# Patient Record
Sex: Male | Born: 1935 | Race: White | Hispanic: No | Marital: Married | Smoking: Former smoker
Health system: Southern US, Community
[De-identification: ages and names within clinical notes are randomized; demographics above are authoritative.]

## PROBLEM LIST (undated history)

## (undated) DIAGNOSIS — C801 Malignant (primary) neoplasm, unspecified: Secondary | ICD-10-CM

## (undated) DIAGNOSIS — I10 Essential (primary) hypertension: Secondary | ICD-10-CM

## (undated) DIAGNOSIS — C679 Malignant neoplasm of bladder, unspecified: Secondary | ICD-10-CM

## (undated) DIAGNOSIS — E7800 Pure hypercholesterolemia, unspecified: Secondary | ICD-10-CM

## (undated) DIAGNOSIS — K219 Gastro-esophageal reflux disease without esophagitis: Secondary | ICD-10-CM

## (undated) HISTORY — PX: BACK SURGERY: SHX140

## (undated) HISTORY — PX: BLADDER SURGERY: SHX569

## (undated) HISTORY — PX: THROAT SURGERY: SHX803

---

## 2014-01-12 DIAGNOSIS — N138 Other obstructive and reflux uropathy: Secondary | ICD-10-CM | POA: Insufficient documentation

## 2014-01-12 DIAGNOSIS — N401 Enlarged prostate with lower urinary tract symptoms: Secondary | ICD-10-CM

## 2014-01-12 DIAGNOSIS — N529 Male erectile dysfunction, unspecified: Secondary | ICD-10-CM | POA: Insufficient documentation

## 2014-01-12 DIAGNOSIS — N281 Cyst of kidney, acquired: Secondary | ICD-10-CM | POA: Insufficient documentation

## 2014-01-19 DIAGNOSIS — K2270 Barrett's esophagus without dysplasia: Secondary | ICD-10-CM | POA: Insufficient documentation

## 2014-01-19 DIAGNOSIS — I739 Peripheral vascular disease, unspecified: Secondary | ICD-10-CM

## 2014-01-19 DIAGNOSIS — I779 Disorder of arteries and arterioles, unspecified: Secondary | ICD-10-CM | POA: Insufficient documentation

## 2014-01-19 DIAGNOSIS — I10 Essential (primary) hypertension: Secondary | ICD-10-CM | POA: Insufficient documentation

## 2014-01-19 DIAGNOSIS — K760 Fatty (change of) liver, not elsewhere classified: Secondary | ICD-10-CM | POA: Insufficient documentation

## 2014-10-21 DIAGNOSIS — R94120 Abnormal auditory function study: Secondary | ICD-10-CM | POA: Insufficient documentation

## 2015-03-24 DIAGNOSIS — L570 Actinic keratosis: Secondary | ICD-10-CM | POA: Insufficient documentation

## 2015-10-21 ENCOUNTER — Ambulatory Visit: Payer: Medicare Other

## 2015-10-21 ENCOUNTER — Ambulatory Visit
Admission: EM | Admit: 2015-10-21 | Discharge: 2015-10-21 | Disposition: A | Discharge status: Home / Self Care | Payer: Medicare Other | Attending: Family Medicine | Admitting: Family Medicine

## 2015-10-21 DIAGNOSIS — Z87891 Personal history of nicotine dependence: Secondary | ICD-10-CM | POA: Diagnosis not present

## 2015-10-21 DIAGNOSIS — J0110 Acute frontal sinusitis, unspecified: Secondary | ICD-10-CM | POA: Diagnosis not present

## 2015-10-21 DIAGNOSIS — R05 Cough: Secondary | ICD-10-CM | POA: Insufficient documentation

## 2015-10-21 DIAGNOSIS — J0100 Acute maxillary sinusitis, unspecified: Secondary | ICD-10-CM | POA: Diagnosis not present

## 2015-10-21 DIAGNOSIS — J8410 Pulmonary fibrosis, unspecified: Secondary | ICD-10-CM | POA: Diagnosis not present

## 2015-10-21 DIAGNOSIS — I700 Atherosclerosis of aorta: Secondary | ICD-10-CM | POA: Diagnosis not present

## 2015-10-21 HISTORY — DX: Malignant neoplasm of bladder, unspecified: C67.9

## 2015-10-21 HISTORY — DX: Essential (primary) hypertension: I10

## 2015-10-21 HISTORY — DX: Malignant (primary) neoplasm, unspecified: C80.1

## 2015-10-21 HISTORY — DX: Pure hypercholesterolemia, unspecified: E78.00

## 2015-10-21 HISTORY — DX: Gastro-esophageal reflux disease without esophagitis: K21.9

## 2015-10-21 MED ORDER — DOXYCYCLINE HYCLATE 100 MG PO CAPS: 100.0000 mg | ORAL_CAPSULE | Freq: Two times a day (BID) | ORAL | Status: DC

## 2015-10-21 MED ORDER — GUAIFENESIN-CODEINE 100-10 MG/5ML PO SOLN: 5.0000 mL | Freq: Three times a day (TID) | ORAL | Status: DC | PRN

## 2015-10-21 NOTE — ED Provider Notes (Signed)
[facility]  ____________________________________________  Time seen: Approximately 10:33 AM  I have reviewed the triage vital signs and the nursing notes.   HISTORY  Chief Complaint URI   HPI Miguel Krause is a 80 y.o. male presents for complaint of one week of runny nose, nasal congestion, sinus pressure, sinus drainage and intermittent cough. Patient reports frequently blowing nose and getting thick greenish nasal drainage. patient states that cough is only occasionally productive. States cough is usually dry can feel the drainage in the back of his throat leading to cough. Reports continues to eat and drink well. Reports wife at home with similar.  States current sinus discomfort is described as a pressure and feels clogged at 2 out of 10. Denies pain radiation. States symptoms have been unresolved with over-the-counter NyQuil or DayQuil. Denies falls or injury.  Denies chest pain, shortness of breath, abdominal pain, weakness, dizziness, neck or back pain. Denies recent sickness. Denies recent antibiotic use.  PCP: [place]   Past Medical History  Diagnosis Date  . Hypertension   . GERD (gastroesophageal reflux disease)   . Hypercholesteremia   . Cancer ([place])   . Bladder cancer ([facility])     There are no active problems to display for this patient.   Past Surgical History  Procedure Laterality Date  . Back surgery    . Bladder surgery      Current Outpatient Rx  Name  Route  Sig  Dispense  Refill  . amLODipine (NORVASC) 10 MG tablet   Oral   Take 10 mg by mouth daily.         Marland Kitchen aspirin 81 MG tablet   Oral   Take 81 mg by mouth daily.         . Coenzyme Q10 (COQ10) 200 MG CAPS   Oral   Take 200 mg by mouth.         . losartan (COZAAR) 100 MG tablet   Oral   Take 100 mg by mouth daily.         Marland Kitchen omeprazole (PRILOSEC) 40 MG capsule   Oral   Take 40 mg by mouth daily.         . pravastatin (PRAVACHOL) 20 MG tablet   Oral   Take 20 mg  by mouth daily.         .           .             Allergies Penicillins and Zocor  Family History  Problem Relation Age of Onset  . Heart failure Mother   . Cancer Father     Social History Social History  Substance Use Topics  . Smoking status: Former Research scientist (life sciences)  . Smokeless tobacco: None  . Alcohol Use: Yes     Comment: socially    Review of Systems Constitutional: No fever/chills Eyes: No visual changes. ENT: No sore throat.Positive for runny nose, nasal congestion, sinus drainage and sinus pressure. Positive intermittent cough.  Cardiovascular: Denies chest pain. Respiratory: Denies shortness of breath. Gastrointestinal: No abdominal pain.  No nausea, no vomiting.  No diarrhea.  No constipation. Genitourinary: Negative for dysuria. Musculoskeletal: Negative for back pain. Skin: Negative for rash. Neurological: Negative for headaches, focal weakness or numbness.  10-point ROS otherwise negative.  ____________________________________________   PHYSICAL EXAM:  VITAL SIGNS: ED Triage Vitals  Enc Vitals Group     BP 10/21/15 0931 159/75 mmHg     Pulse Rate 10/21/15 0931 92  Resp 10/21/15 0931 18     Temp 10/21/15 0931 98 F (36.7 C)     Temp Source 10/21/15 0931 Tympanic     SpO2 10/21/15 0931 98 %     Weight 10/21/15 0931 215 lb (97.523 kg)     Height 10/21/15 0931 6\' 1"  (1.854 m)     Head Cir --      Peak Flow --      Pain Score 10/21/15 0935 6     Pain Loc --      Pain Edu? --      Excl. in [place]? --     Constitutional: Alert and oriented. Well appearing and in no acute distress. Eyes: Conjunctivae are normal. PERRL. EOMI. Head: Atraumatic.Mild to moderate tenderness to palpation bilateral frontal and maxillary sinuses. No swelling. No erythema.  Ears: no erythema, normal TMs bilaterally.   Nose:Nasal congestion with bilateral nasal turbinate erythema and greenish nasal drainage.  Mouth/Throat: Mucous membranes are moist.  Oropharynx  non-erythematous. No tonsillar swelling or exudate.  Neck: No stridor.  No cervical spine tenderness to palpation. Hematological/Lymphatic/Immunilogical: No cervical lymphadenopathy. Cardiovascular: Normal rate, regular rhythm. Grossly normal heart sounds.  Good peripheral circulation. Respiratory: Normal respiratory effort.  No retractions. Lungs CTAB.No wheezes, rales or rhonchi. Good air movement.  Gastrointestinal: Soft and nontender. Musculoskeletal: No lower or upper extremity tenderness nor edema.  Bilateral pedal pulses equal and easily palpated.  Neurologic:  Normal speech and language. No gross focal neurologic deficits are appreciated. No gait instability. Skin:  Skin is warm, dry and intact. No rash noted. Psychiatric: Mood and affect are normal. Speech and behavior are normal.  ____________________________________________   LABS (all labs ordered are listed, but only abnormal results are displayed)  Labs Reviewed - No data to display  RADIOLOGY  EXAM: CHEST 2 VIEW  COMPARISON: None.  FINDINGS: Calcified granuloma in the posterior basal segment of the right lower lobe. Probable small calcified lymph nodes about the right hilum. Overall normal mediastinal contours. Calcified aortic atherosclerosis. Visualized tracheal air column is within normal limits. Normal lung volumes. No pneumothorax, pulmonary edema, pleural effusion or other confluent pulmonary opacity. Degenerative changes in the spine. No acute osseous abnormality identified.  IMPRESSION: 1. Largely unremarkable for age chest radiograph. No acute cardiopulmonary abnormality. 2. Calcified aortic atherosclerosis. Remote granulomatous disease in the right lung.   Electronically Signed By: Genevie Ann M.D. On: 10/21/2015 10:12  I, Marylene Land, personally viewed and evaluated these images (plain radiographs) as part of my medical decision making, as well as reviewing the written report by the  radiologist.   [place] / [place] / ED COURSE  Pertinent labs & imaging results that were available during my care of the patient were reviewed by me and considered in my medical decision making (see chart for details).  Well-appearing patient. No acute distress. Presents with complaints of one week of runny nose, nasal congestion, sinus pressure and sinus change. Reports intermittent cough. Patient with mild to moderate tenderness to palpation bilateral frontal and maxillary sinuses. Lungs clear throughout. Afebrile. Chest x-ray ordered by nurses.  Chest x-ray per radiologist's largely unremarkable for age chest radiograph no acute cardiopulmonary abnormality; calcified aortic atherosclerosis with remote granulomatous disease in right lung.Copy of chest x-ray report given to patient to take an follow-up with primary care physician.  Will treat patient sinusitis with oral doxycycline, as he is penicillin allergic. And when necessary guaifenesin with codeine, encouraged rest,  fluids and PCP follow up.  Discussed follow  up with Primary care physician this week. Discussed follow up and return parameters including no resolution or any worsening concerns. Patient verbalized understanding and agreed to plan.   ____________________________________________   FINAL CLINICAL IMPRESSION(S) / ED DIAGNOSES  Final diagnoses:  Acute frontal sinusitis, recurrence not specified  Acute maxillary sinusitis, recurrence not specified       Marylene Land, NP 10/21/15 1053

## 2015-10-21 NOTE — Discharge Instructions (Signed)
Take medication as prescribed. Rest. Drink regularly.  Follow-up closely with her primary care physician. Return to urgent care as needed for new or worsening concerns.  Sinusitis, Adult Sinusitis is redness, soreness, and inflammation of the paranasal sinuses. Paranasal sinuses are air pockets within the bones of your face. They are located beneath your eyes, in the middle of your forehead, and above your eyes. In healthy paranasal sinuses, mucus is able to drain out, and air is able to circulate through them by way of your nose. However, when your paranasal sinuses are inflamed, mucus and air can become trapped. This can allow bacteria and other germs to grow and cause infection. Sinusitis can develop quickly and last only a short time (acute) or continue over a long period (chronic). Sinusitis that lasts for more than 12 weeks is considered chronic. CAUSES Causes of sinusitis include:  Allergies.  Structural abnormalities, such as displacement of the cartilage that separates your nostrils (deviated septum), which can decrease the air flow through your nose and sinuses and affect sinus drainage.  Functional abnormalities, such as when the small hairs (cilia) that line your sinuses and help remove mucus do not work properly or are not present. SIGNS AND SYMPTOMS Symptoms of acute and chronic sinusitis are the same. The primary symptoms are pain and pressure around the affected sinuses. Other symptoms include:  Upper toothache.  Earache.  Headache.  Bad breath.  Decreased sense of smell and taste.  A cough, which worsens when you are lying flat.  Fatigue.  Fever.  Thick drainage from your nose, which often is green and may contain pus (purulent).  Swelling and warmth over the affected sinuses. DIAGNOSIS Your health care provider will perform a physical exam. During your exam, your health care provider may perform any of the following to help determine if you have acute sinusitis  or chronic sinusitis:  Look in your nose for signs of abnormal growths in your nostrils (nasal polyps).  Tap over the affected sinus to check for signs of infection.  View the inside of your sinuses using an imaging device that has a light attached (endoscope). If your health care provider suspects that you have chronic sinusitis, one or more of the following tests may be recommended:  Allergy tests.  Nasal culture. A sample of mucus is taken from your nose, sent to a lab, and screened for bacteria.  Nasal cytology. A sample of mucus is taken from your nose and examined by your health care provider to determine if your sinusitis is related to an allergy. TREATMENT Most cases of acute sinusitis are related to a viral infection and will resolve on their own within 10 days. Sometimes, medicines are prescribed to help relieve symptoms of both acute and chronic sinusitis. These may include pain medicines, decongestants, nasal steroid sprays, or saline sprays. However, for sinusitis related to a bacterial infection, your health care provider will prescribe antibiotic medicines. These are medicines that will help kill the bacteria causing the infection. Rarely, sinusitis is caused by a fungal infection. In these cases, your health care provider will prescribe antifungal medicine. For some cases of chronic sinusitis, surgery is needed. Generally, these are cases in which sinusitis recurs more than 3 times per year, despite other treatments. HOME CARE INSTRUCTIONS  Drink plenty of water. Water helps thin the mucus so your sinuses can drain more easily.  Use a humidifier.  Inhale steam 3-4 times a day (for example, sit in the bathroom with the shower running).  Apply a warm, moist washcloth to your face 3-4 times a day, or as directed by your health care provider.  Use saline nasal sprays to help moisten and clean your sinuses.  Take medicines only as directed by your health care provider.  If  you were prescribed either an antibiotic or antifungal medicine, finish it all even if you start to feel better. SEEK IMMEDIATE MEDICAL CARE IF:  You have increasing pain or severe headaches.  You have nausea, vomiting, or drowsiness.  You have swelling around your face.  You have vision problems.  You have a stiff neck.  You have difficulty breathing.   This information is not intended to replace advice given to you by your health care provider. Make sure you discuss any questions you have with your health care provider.   Document Released: 10/01/2005 Document Revised: 10/22/2014 Document Reviewed: 10/16/2011 Elsevier Interactive Patient Education [employer].

## 2015-10-21 NOTE — ED Notes (Signed)
Started last Friday with slight sinus drainage. States Sunday symptoms worsen and has worsening cough, productive yellow/brown. Initially had fever but none since early week.

## 2015-12-22 DIAGNOSIS — C159 Malignant neoplasm of esophagus, unspecified: Secondary | ICD-10-CM | POA: Insufficient documentation

## 2016-07-08 ENCOUNTER — Ambulatory Visit
Admission: EM | Admit: 2016-07-08 | Discharge: 2016-07-08 | Disposition: A | Discharge status: Home / Self Care | Payer: Medicare Other | Attending: Family Medicine | Admitting: Family Medicine

## 2016-07-08 ENCOUNTER — Encounter: Payer: Self-pay | Admitting: Emergency Medicine

## 2016-07-08 DIAGNOSIS — J029 Acute pharyngitis, unspecified: Secondary | ICD-10-CM | POA: Insufficient documentation

## 2016-07-08 DIAGNOSIS — Z87891 Personal history of nicotine dependence: Secondary | ICD-10-CM | POA: Insufficient documentation

## 2016-07-08 DIAGNOSIS — R1310 Dysphagia, unspecified: Secondary | ICD-10-CM | POA: Insufficient documentation

## 2016-07-08 DIAGNOSIS — Z880 Allergy status to penicillin: Secondary | ICD-10-CM | POA: Diagnosis not present

## 2016-07-08 LAB — RAPID STREP SCREEN (MED CTR MEBANE ONLY): Streptococcus, Group A Screen (Direct): NEGATIVE

## 2016-07-08 NOTE — Discharge Instructions (Signed)
Follow up with your surgeon this week. Call tomorrow.   Eat soft food diet, frequent fluids.   Follow up with your primary care physician this week as needed. Return to Urgent care or ER for new or worsening concerns.

## 2016-07-08 NOTE — ED Triage Notes (Signed)
Patient c/o sore throat and cough that started a week ago. Patient denies fevers.

## 2016-07-08 NOTE — ED Provider Notes (Signed)
[facility] ____________________________________________  Time seen: Approximately 8:06 PM  I have reviewed the triage vital signs and the nursing notes.   HISTORY  Chief Complaint Sore Throat  HPI Miguel Krause is a 80 y.o. male presenting for a chief complaint of sore throat with associated cough. Patient reports that this is been present for the last 7 days. Patient reports overall this has been a gradual onset. Patient describes his sore throat as a feeling of things sticking in his throat, but denies inability to swallow. Patient states that when he eats he then has the sensation to cough and he coughs some of the food back up. States if he then dilutes the food with water, he can swallow it.  Patient reports that he drinks plain water he does not have the same sensation. Patient reports the thicker the liquid or the food is that he swallows the increased sensation to cough he has and has to use more water. Denies vomiting. States can eat soft foods like mashed potatoes well without problems.   Patient states he has been able to continue to eat and drink but has been more selective in what he could eat or drink. Patient again reports he can drink water or thin substances without difficulty. Patient reports he has had a 3 pound weight loss this week. Patient denies abrupt onset of these symptoms. Denies sensation of something stuck in throat. Denies any cough and absence of trying to drink or eat something.  Denies any runny nose, nasal congestion, chest congestion. Denies chest pain, numbness, sensation changes, weakness, shortness of breath, dizziness, headache, dysuria, abdominal pain, neck or back pain. Denies recent sickness. Patient reports that he did have esophageal cancer and underwent multiple surgeries including esophagectomy, gastrectomy, roux-en-y esophagojuenal anastomosis in May of this year.   Loren Racer, MD, MDPCP GI: Lelon Frohlich    Past Medical  History:  Diagnosis Date  . Bladder cancer ([place])   . Cancer ([place])   . GERD (gastroesophageal reflux disease)   . Hypercholesteremia   . Hypertension     There are no active problems to display for this patient.   Past Surgical History:  Procedure Laterality Date  . BACK SURGERY    . BLADDER SURGERY    . THROAT SURGERY      Current Outpatient Rx  . Order #: [id number] Class: Historical Med  . Order #: [id number] Class: Historical Med  . Order #: [id number] Class: Historical Med  . Order #: [id number] Class: Normal  . Order #: [id number] Class: Print  . Order #: [id number] Class: Historical Med  . Order #: [id number] Class: Historical Med  . Order #: [id number] Class: Historical Med    No current facility-administered medications for this encounter.   Current Outpatient Prescriptions:  .  amLODipine (NORVASC) 10 MG tablet, Take 10 mg by mouth daily., Disp: , Rfl:  .  aspirin 81 MG tablet, Take 81 mg by mouth daily., Disp: , Rfl:  .  Coenzyme Q10 (COQ10) 200 MG CAPS, Take 200 mg by mouth., Disp: , Rfl:  .  doxycycline (VIBRAMYCIN) 100 MG capsule, Take 1 capsule (100 mg total) by mouth 2 (two) times daily., Disp: 20 capsule, Rfl: 0 .  guaiFENesin-codeine 100-10 MG/5ML syrup, Take 5 mLs by mouth 3 (three) times daily as needed for cough., Disp: 75 mL, Rfl: 0 .  losartan (COZAAR) 100 MG tablet, Take 100 mg by mouth daily., Disp: , Rfl:  .  omeprazole (PRILOSEC) 40 MG capsule, Take 40 mg by mouth daily., Disp: ,  Rfl:  .  pravastatin (PRAVACHOL) 20 MG tablet, Take 20 mg by mouth daily., Disp: , Rfl:   Allergies Penicillins and Zocor [simvastatin]  Family History  Problem Relation Age of Onset  . Heart failure Mother   . Cancer Father     Social History Social History  Substance Use Topics  . Smoking status: Former Research scientist (life sciences)  . Smokeless tobacco: Never Used  . Alcohol use Yes     Comment: socially    Review of Systems Constitutional: No fever/chills Eyes: No visual changes. ENT: As  above Cardiovascular: Denies chest pain. Respiratory: Denies shortness of breath. Gastrointestinal: No abdominal pain.  No nausea, no vomiting.  No diarrhea.  No constipation. Genitourinary: Negative for dysuria. Musculoskeletal: Negative for back pain. Skin: Negative for rash. Neurological: Negative for headaches, focal weakness or numbness.  10-point ROS otherwise negative.  ____________________________________________   PHYSICAL EXAM:  VITAL SIGNS: ED Triage Vitals  Enc Vitals Group     BP 07/08/16 1504 (!) 146/71     Pulse Rate 07/08/16 1504 64     Resp 07/08/16 1504 16     Temp 07/08/16 1504 97.2 F (36.2 C)     Temp Source 07/08/16 1504 Tympanic     SpO2 07/08/16 1504 99 %     Weight 07/08/16 1504 176 lb (79.8 kg)     Height 07/08/16 1504 6\' 1"  (1.854 m)     Head Circumference --      Peak Flow --      Pain Score 07/08/16 1506 2     Pain Loc --      Pain Edu? --      Excl. in [place]? --     Constitutional: Alert and oriented. Well appearing and in no acute distress. Eyes: Conjunctivae are normal. PERRL. EOMI. Head: Atraumatic. No sinus tenderness to palpation. No swelling. No erythema.  Ears: no erythema, normal TMs bilaterally.   Nose:No nasal congestion or rhinorrhea.  Mouth/Throat: Mucous membranes are moist. No pharyngeal erythema. No tonsillar swelling or exudate. No mass palpated. No drooling.  Neck: No stridor.  No cervical spine tenderness to palpation. Hematological/Lymphatic/Immunilogical: No cervical lymphadenopathy. Cardiovascular: Normal rate, regular rhythm. Grossly normal heart sounds.  Good peripheral circulation. Respiratory: Normal respiratory effort.  No retractions. Lungs CTAB. No wheezes, rales or rhonchi. Good air movement.  Gastrointestinal: Soft and nontender. Normal Bowel sounds. No CVA tenderness. Musculoskeletal: Steady gait. Neurologic:  Normal speech and language. No gross focal neurologic deficits are appreciated. No gait  instability. Skin:  Skin is warm, dry and intact. No rash noted. Psychiatric: Mood and affect are normal. Speech and behavior are normal.  ___________________________________________   LABS (all labs ordered are listed, but only abnormal results are displayed)  Labs Reviewed  RAPID STREP SCREEN (NOT AT [facility])  CULTURE, GROUP A STREP [facility])   _ PROCEDURES Procedures   INITIAL IMPRESSION / ASSESSMENT AND PLAN / ED COURSE  Pertinent labs & imaging results that were available during my care of the patient were reviewed by me and considered in my medical decision making (see chart for details).  Very well-appearing patient. No acute distress. Moist mucous membranes. Presenting for the complaints of gradual onset over one week of difficulty swallowing foods and fluids. Reports no difficulty with water. Reports has continued to eat and drink some food and fluids but with difficulty as described above. From patient description, as well as recent extensive surgeries, concern for postsurgical complication. Patient reports that he is scheduled to have a standard  three-month follow-up endoscopy next week. Doubt infectious etiology. Quick strep negative, will culture. On call physician for patients [facility] gastroenterology surgical group paged.  Discussed patient and planning care in detail with the surgeon on call for patients GI surgeon Dr.Jagganath, Dr Claudie Fisherman. Per Dr Claudie Fisherman, she recommends for patient to call their office first thing tomorrow to reschedule patient's endoscopy for this week, as well as she does not recommend further evaluation or seeking care through the emergency room at this time. Discussed in detail with patient recommendations from surgeon, and discuss planning care. Recommended for patient to follow-up closely with his outpatient surgeon and call their office tomorrow morning. Encourage soft food diet as well as fluids. Discussed in detail with patient for any  worsening concerns, inability to swallow or other changes to proceed directly to the emergency room.  Discussed follow up with Primary care physician this week. Discussed follow up and return parameters including no resolution or any worsening concerns. Patient verbalized understanding and agreed to plan.   ____________________________________________   FINAL CLINICAL IMPRESSION(S) / ED DIAGNOSES  Final diagnoses:  Dysphagia     Discharge Medication List as of 07/08/2016  5:19 PM      Note: This dictation was prepared with Dragon dictation along with smaller phrase technology. Any transcriptional errors that result from this process are unintentional.    Clinical Course      Marylene Land, NP 07/08/16 2027

## 2016-07-10 ENCOUNTER — Telehealth: Payer: Self-pay

## 2016-07-10 NOTE — Telephone Encounter (Signed)
Performed patient courtesy callback from recent visit here at [facility]. Patient reports that he is unchanged but did have his Endoscopy moved up to tomorrow and is excited about this. Patient hopes he will get additional answers on problems tomorrow. Patient advised to call back with any questions and concerns. [facility]

## 2016-07-11 LAB — CULTURE, GROUP A STREP (THRC)

## 2016-07-12 ENCOUNTER — Telehealth: Payer: Self-pay | Admitting: *Deleted

## 2017-02-09 IMAGING — CR DG CHEST 2V
2 series · 2 of 2 positions shown · non-contrast
Comparison: None.

CLINICAL DATA: 79-year-old male with cough for 6 days with fever
and chest tightness. Former smoker. Initial encounter.

EXAM:
CHEST  2 VIEW

[chest pa]
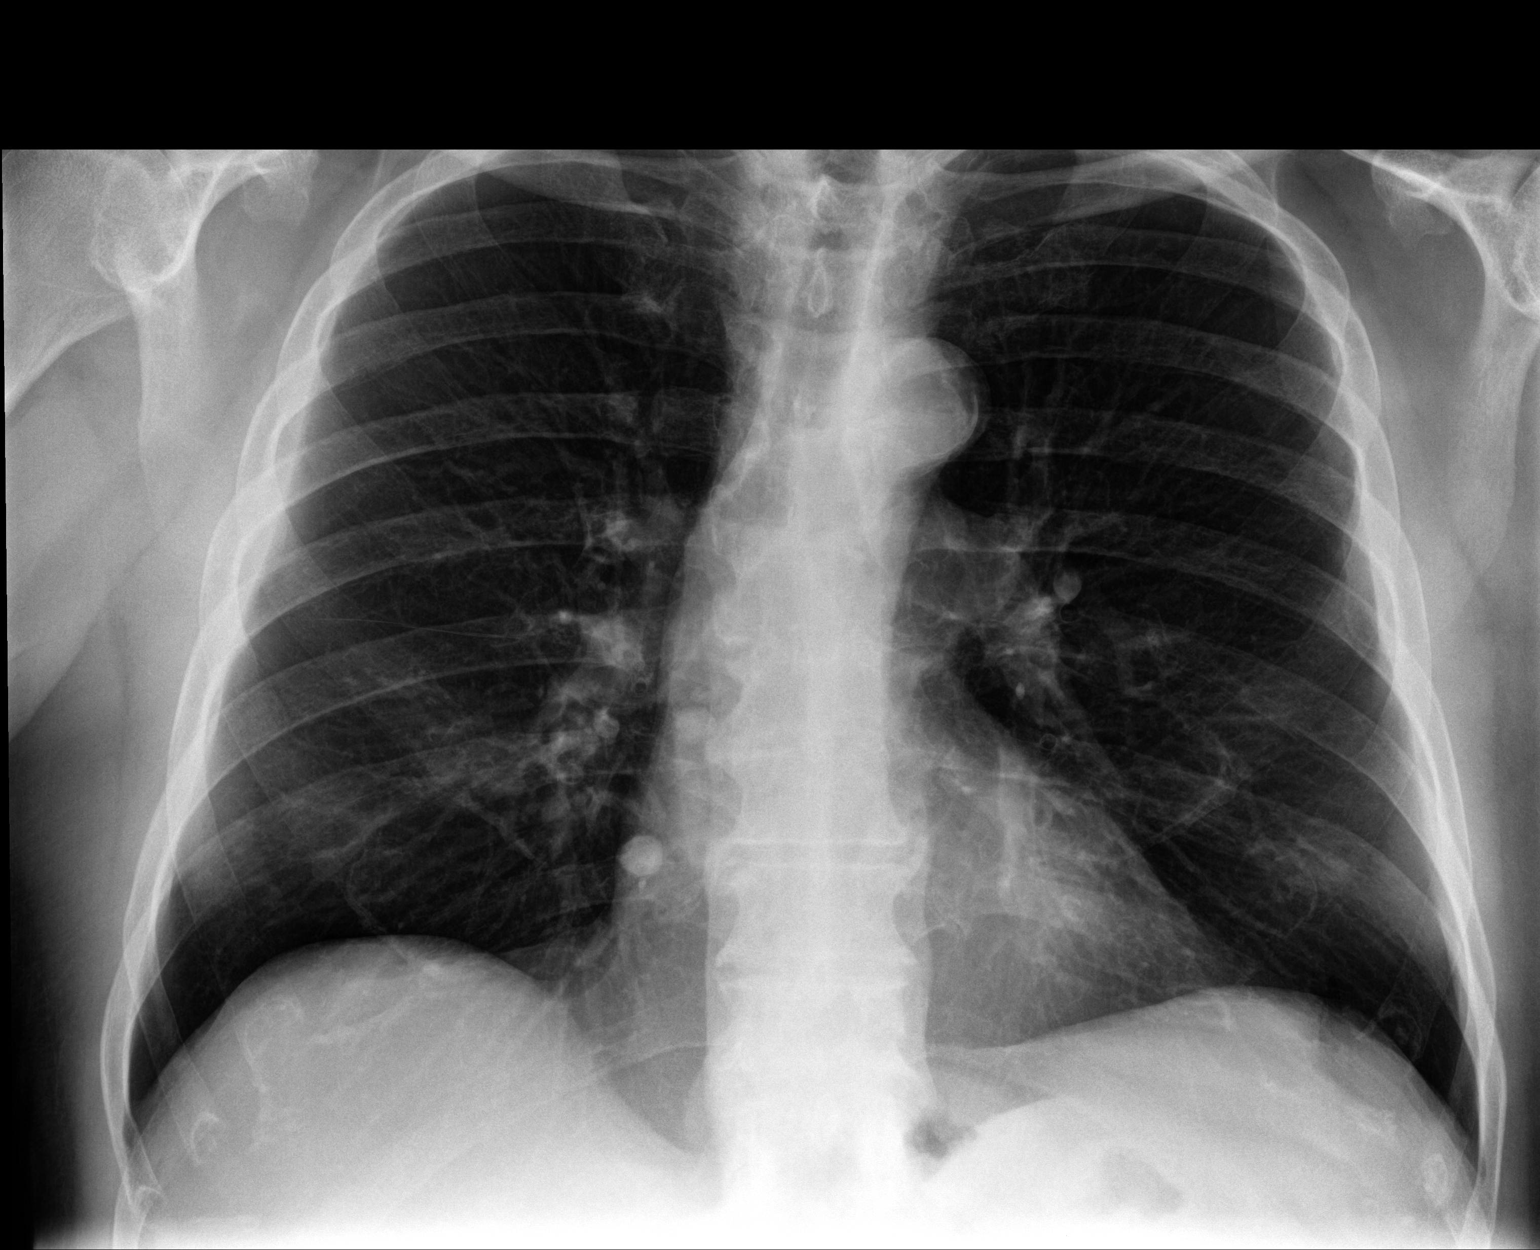

[chest lat]
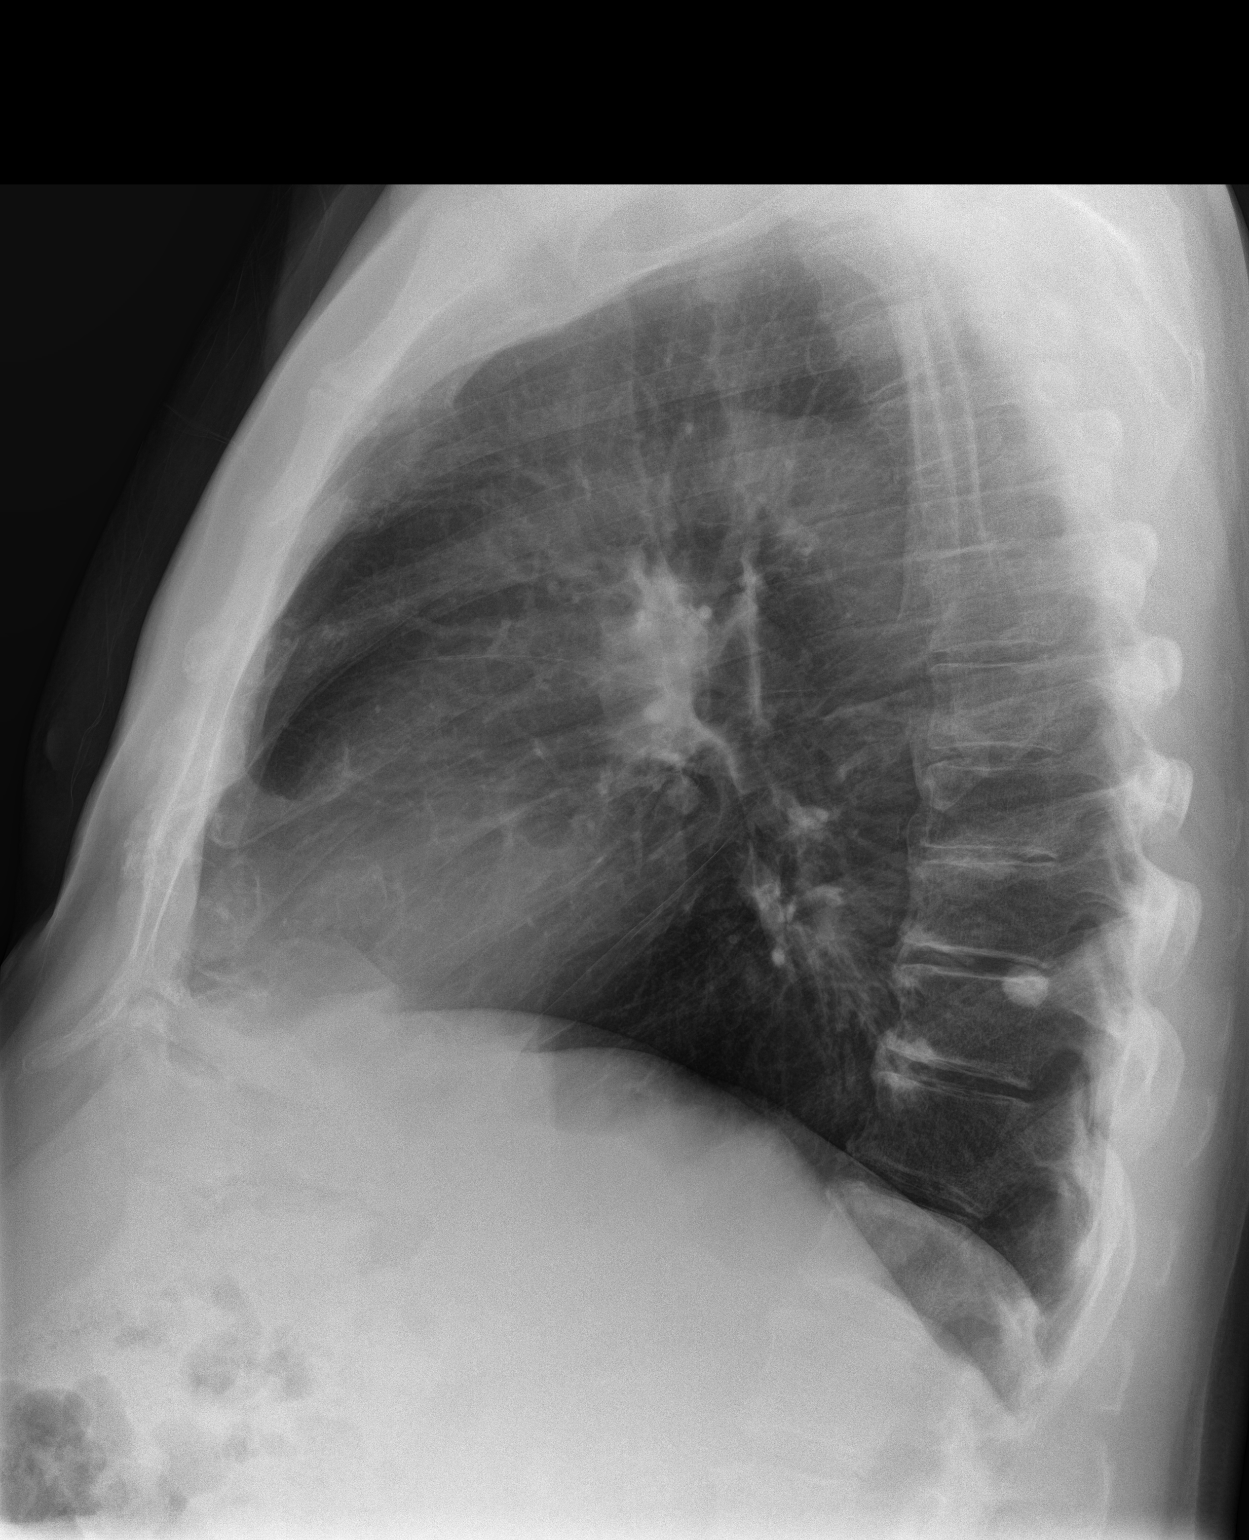

[2 of 2 positions shown; findings below may reference images not displayed]

FINDINGS: Calcified granuloma in the posterior basal segment of the right
lower lobe. Probable small calcified lymph nodes about the right
hilum. Overall normal mediastinal contours. Calcified aortic
atherosclerosis. Visualized tracheal air column is within normal
limits. Normal lung volumes. No pneumothorax, pulmonary edema,
pleural effusion or other confluent pulmonary opacity. Degenerative
changes in the spine. No acute osseous abnormality identified.
IMPRESSION: 1. Largely unremarkable for age chest radiograph. No acute
cardiopulmonary abnormality.
2. Calcified aortic atherosclerosis. Remote granulomatous disease in
the right lung.

## 2018-02-27 ENCOUNTER — Encounter: Payer: Self-pay | Admitting: Gynecology

## 2018-02-27 ENCOUNTER — Other Ambulatory Visit: Payer: Self-pay

## 2018-02-27 ENCOUNTER — Ambulatory Visit
Admission: EM | Admit: 2018-02-27 | Discharge: 2018-02-27 | Disposition: A | Discharge status: Home / Self Care | Payer: Medicare Other | Attending: Family Medicine | Admitting: Family Medicine

## 2018-02-27 DIAGNOSIS — K219 Gastro-esophageal reflux disease without esophagitis: Secondary | ICD-10-CM | POA: Diagnosis not present

## 2018-02-27 DIAGNOSIS — R399 Unspecified symptoms and signs involving the genitourinary system: Secondary | ICD-10-CM

## 2018-02-27 DIAGNOSIS — N138 Other obstructive and reflux uropathy: Secondary | ICD-10-CM | POA: Diagnosis not present

## 2018-02-27 DIAGNOSIS — Z8501 Personal history of malignant neoplasm of esophagus: Secondary | ICD-10-CM | POA: Diagnosis not present

## 2018-02-27 DIAGNOSIS — N401 Enlarged prostate with lower urinary tract symptoms: Secondary | ICD-10-CM | POA: Insufficient documentation

## 2018-02-27 DIAGNOSIS — Z8551 Personal history of malignant neoplasm of bladder: Secondary | ICD-10-CM | POA: Insufficient documentation

## 2018-02-27 DIAGNOSIS — Z7982 Long term (current) use of aspirin: Secondary | ICD-10-CM | POA: Diagnosis not present

## 2018-02-27 DIAGNOSIS — Z87891 Personal history of nicotine dependence: Secondary | ICD-10-CM | POA: Insufficient documentation

## 2018-02-27 DIAGNOSIS — Z79899 Other long term (current) drug therapy: Secondary | ICD-10-CM | POA: Insufficient documentation

## 2018-02-27 DIAGNOSIS — N529 Male erectile dysfunction, unspecified: Secondary | ICD-10-CM | POA: Insufficient documentation

## 2018-02-27 DIAGNOSIS — E7800 Pure hypercholesterolemia, unspecified: Secondary | ICD-10-CM | POA: Diagnosis not present

## 2018-02-27 DIAGNOSIS — R3915 Urgency of urination: Secondary | ICD-10-CM | POA: Diagnosis not present

## 2018-02-27 DIAGNOSIS — K2270 Barrett's esophagus without dysplasia: Secondary | ICD-10-CM | POA: Diagnosis not present

## 2018-02-27 DIAGNOSIS — R350 Frequency of micturition: Secondary | ICD-10-CM | POA: Insufficient documentation

## 2018-02-27 DIAGNOSIS — R300 Dysuria: Secondary | ICD-10-CM | POA: Diagnosis present

## 2018-02-27 DIAGNOSIS — I10 Essential (primary) hypertension: Secondary | ICD-10-CM | POA: Diagnosis not present

## 2018-02-27 DIAGNOSIS — N281 Cyst of kidney, acquired: Secondary | ICD-10-CM | POA: Diagnosis not present

## 2018-02-27 LAB — URINALYSIS, COMPLETE (UACMP) WITH MICROSCOPIC
Bilirubin Urine: NEGATIVE
Glucose, UA: NEGATIVE mg/dL
Hgb urine dipstick: NEGATIVE
Leukocytes, UA: NEGATIVE
Nitrite: NEGATIVE
PH: 5.5 (ref 5.0–8.0)
PROTEIN: 30 mg/dL — AB
RBC / HPF: NONE SEEN RBC/hpf (ref 0–5)
Specific Gravity, Urine: >1.03 — ABNORMAL HIGH (ref 1.005–1.030)

## 2018-02-27 MED ORDER — TAMSULOSIN HCL 0.4 MG PO CAPS: 0.4000 mg | ORAL_CAPSULE | Freq: Every day | ORAL | 1 refills | Status: AC

## 2018-02-27 NOTE — ED Provider Notes (Signed)
[facility]    CSN: [id number] Arrival date & time: 02/27/18  1918  History   Chief Complaint Urinary symptoms  HPI  82 year old male with a history of bladder cancer and recent biopsy presents with urinary symptoms.  Started yesterday. Reports frequency & urgency. Mild dysuria. Has a history of BPH. No fever. No exacerbating or relieving factors. No other associated symptoms. No other complaints.   Past Medical History:  Diagnosis Date  . Bladder cancer ([place])   . Cancer ([place])   . GERD (gastroesophageal reflux disease)   . Hypercholesteremia   . Hypertension    Patient Active Problem List   Diagnosis Date Noted  . Esophageal cancer ([place]) 12/22/2015  . Actinic keratosis 03/24/2015  . Abnormal hearing screen 10/21/2014  . Barrett's esophagus 01/19/2014  . Benign essential hypertension 01/19/2014  . Carotid artery disease ([place]) 01/19/2014  . Fatty liver 01/19/2014  . Acquired cyst of kidney 01/12/2014  . BPH with obstruction/lower urinary tract symptoms 01/12/2014  . ED (erectile dysfunction) of organic origin 01/12/2014   Past Surgical History:  Procedure Laterality Date  . BACK SURGERY    . BLADDER SURGERY    . THROAT SURGERY      Home Medications    Prior to Admission medications   Medication Sig Start Date End Date Taking? Authorizing Provider  amLODipine (NORVASC) 10 MG tablet Take 10 mg by mouth daily.   Yes [provider]  Ascorbic Acid 100 MG CHEW Chew by mouth.   Yes [provider]  aspirin 81 MG tablet Take 81 mg by mouth daily.   Yes [provider]  Coenzyme Q10 (COQ10) 200 MG CAPS Take 200 mg by mouth.   Yes [provider]  Cranberry Extract 250 MG TABS Take by mouth.   Yes [provider]  losartan (COZAAR) 100 MG tablet Take 100 mg by mouth daily.   Yes [provider]  omeprazole (PRILOSEC) 40 MG capsule Take 40 mg by mouth daily.   Yes [provider]  pravastatin  (PRAVACHOL) 20 MG tablet Take 20 mg by mouth daily.   Yes [provider]  tamsulosin (FLOMAX) 0.4 MG CAPS capsule Take 1 capsule (0.4 mg total) by mouth daily. 02/27/18   Coral Spikes, DO    Family History Family History  Problem Relation Age of Onset  . Heart failure Mother   . Cancer Father     Social History Social History   Tobacco Use  . Smoking status: Former Research scientist (life sciences)  . Smokeless tobacco: Never Used  Substance Use Topics  . Alcohol use: Yes    Comment: socially  . Drug use: No   Allergies   Penicillins and Zocor [simvastatin]  Review of Systems Review of Systems  Constitutional: Negative.   Genitourinary: Positive for dysuria, frequency and urgency.   Physical Exam Triage Vital Signs ED Triage Vitals  Enc Vitals Group     BP 02/27/18 1958 136/83     Pulse Rate 02/27/18 1958 73     Resp 02/27/18 1958 16     Temp 02/27/18 1958 98.3 F (36.8 C)     Temp Source 02/27/18 1958 Oral     SpO2 02/27/18 1958 98 %     Weight 02/27/18 2001 166 lb (75.3 kg)     Height 02/27/18 2001 6' (1.829 m)     Head Circumference --      Peak Flow --      Pain Score 02/27/18 2000 5  Pain Loc --      Pain Edu? --      Excl. in [place]? --    Updated Vital Signs BP 136/83 (BP Location: Left Arm)   Pulse 73   Temp 98.3 F (36.8 C) (Oral)   Resp 16   Ht 6' (1.829 m)   Wt 166 lb (75.3 kg)   SpO2 98%   BMI 22.51 kg/m     Physical Exam  Constitutional: He is oriented to person, place, and time. He appears well-developed. No distress.  Cardiovascular: Normal rate and regular rhythm.  Pulmonary/Chest: Effort normal and breath sounds normal.  Abdominal: Soft. He exhibits no distension. There is no tenderness.  Neurological: He is alert and oriented to person, place, and time.  Psychiatric: He has a normal mood and affect. His behavior is normal.  Nursing note and vitals reviewed.  UC Treatments / Results  Labs (all labs ordered are listed, but only abnormal results  are displayed) Labs Reviewed  URINALYSIS, COMPLETE (UACMP) WITH MICROSCOPIC - Abnormal; Notable for the following components:      Result Value   Specific Gravity, Urine >1.030 (*)    Ketones, ur TRACE (*)    Protein, ur 30 (*)    Bacteria, UA RARE (*)    All other components within normal limits  URINE CULTURE   EKG None  Radiology No results found.  Procedures Procedures (including critical care time)  Medications Ordered in UC Medications - No data to display  Initial Impression / Assessment and Plan / UC Course  I have reviewed the triage vital signs and the nursing notes.  Pertinent labs & imaging results that were available during my care of the patient were reviewed by me and considered in my medical decision making (see chart for details).    82 year old male presents with LUTS. No evidence of UTI on UA. Crystals noted. Patient has difficulty hydrating due to prior esophageal cancer and surgery. Has known BPH. Trial of Flomax. Advised to follow up with Urology.   Final Clinical Impressions(s) / UC Diagnoses   Final diagnoses:  Lower urinary tract symptoms (LUTS)     Discharge Instructions     Medication as prescribed.  Please call your urologist if symptoms persist.  Take care  Dr. Lacinda Axon    ED Prescriptions    Medication Sig [place]. Provider   tamsulosin (FLOMAX) 0.4 MG CAPS capsule Take 1 capsule (0.4 mg total) by mouth daily. 30 capsule Coral Spikes, DO     Controlled Substance Prescriptions [place] Controlled Substance Registry consulted? Not Applicable   Coral Spikes, DO 02/27/18 2339

## 2018-02-27 NOTE — ED Triage Notes (Signed)
Patient stated had bladder biopsy on 02/06/2018. Per patient has urinary problem since then. Pt c/o urgency and painful urination.

## 2018-02-27 NOTE — Discharge Instructions (Signed)
Medication as prescribed.  Please call your urologist if symptoms persist.  Take care  Dr. Lacinda Axon

## 2018-03-01 LAB — URINE CULTURE: CULTURE: NO GROWTH

## 2020-04-24 ENCOUNTER — Other Ambulatory Visit: Payer: Self-pay

## 2020-04-24 ENCOUNTER — Ambulatory Visit
Admission: RE | Admit: 2020-04-24 | Discharge: 2020-04-24 | Disposition: A | Discharge status: Home / Self Care | Payer: Medicare Other | Source: Ambulatory Visit | Attending: Emergency Medicine | Admitting: Emergency Medicine

## 2020-04-24 VITALS — BP 109/71 | HR 96 | Temp 98.6°F | Resp 18 | Ht 73.0 in | Wt 165.0 lb

## 2020-04-24 DIAGNOSIS — N3001 Acute cystitis with hematuria: Secondary | ICD-10-CM

## 2020-04-24 LAB — URINALYSIS, COMPLETE (UACMP) WITH MICROSCOPIC
Glucose, UA: NEGATIVE mg/dL
Nitrite: NEGATIVE
Protein, ur: 100 mg/dL — AB
RBC / HPF: >50 RBC/hpf (ref 0–5)
Specific Gravity, Urine: 1.02 (ref 1.005–1.030)
Squamous Epithelial / LPF: NONE SEEN (ref 0–5)
WBC, UA: >50 WBC/hpf (ref 0–5)
pH: 5.5 (ref 5.0–8.0)

## 2020-04-24 MED ORDER — SULFAMETHOXAZOLE-TRIMETHOPRIM 400-80 MG PO TABS: 2.0000 | ORAL_TABLET | Freq: Two times a day (BID) | ORAL | 0 refills | Status: AC

## 2020-04-24 NOTE — ED Triage Notes (Signed)
Patient in today c/o urinary urgency x 1 day. Patient states he took OTC "antibacterial plus urinary pain relief" with relief yesterday, but not this morning.

## 2020-04-24 NOTE — Discharge Instructions (Addendum)
You were seen for urinary urgency and are being treated for urinary tract infection.   We are sending your urine out for a culture. If we need to add or change any medications, our nurse will give you a call to let you know.  Take the antibiotics as prescribed until they're finished. If you think you're having a reaction, stop the medication, take benadryl and go to the nearest urgent care/emergency room. Take a probiotic while taking the antibiotic to decrease the chances of stomach upset.   Increase your water intake.  Take care, Dr. Marland Kitchen, NP-c

## 2020-04-24 NOTE — ED Provider Notes (Signed)
[facility] - [place], [place]   Name: Miguel Krause DOB: 1936-06-15 MRN: [id number] CSN: [id number] PCP: Loren Racer, MD  Arrival date and time:  04/24/20 1354  Chief Complaint:  Appointment and Urinary Urgency   NOTE: Prior to seeing the patient today, I have reviewed the triage nursing documentation and vital signs. Clinical staff has updated patient's PMH/PSHx, current medication list, and drug allergies/intolerances to ensure comprehensive history available to assist in medical decision making.   History:   HPI: Miguel Krause is a 84 y.o. male who presents today with complaints of urinary hesitancy and urgency.  Patient states he first noted the symptoms approximately 24 hours ago.  He had an extreme sense of urgency and when he went to the restroom to attempt to void, very little urine was produced.  He also noted some residual burning after urination.  He states his last experience with urinary tract infections was "a couple years ago".  He has taken over-the-counter urinary pain relief tablet to help alleviate his symptoms, but he did not take one today.  Pertinent history includes bladder cancer.  He states he has no recent use of antibiotics.  No concerns of sexually transmitted infections.  Past Medical History:  Diagnosis Date  . Bladder cancer ([place])   . Cancer ([place])   . GERD (gastroesophageal reflux disease)   . Hypercholesteremia   . Hypertension     Past Surgical History:  Procedure Laterality Date  . BACK SURGERY    . BLADDER SURGERY    . THROAT SURGERY      Family History  Problem Relation Age of Onset  . Heart failure Mother   . Cancer Father     Social History   Tobacco Use  . Smoking status: Former Smoker    Quit date: 04/24/1980    Years since quitting: 40.0  . Smokeless tobacco: Never Used  Vaping Use  . Vaping Use: Never used  Substance Use Topics  . Alcohol use: Yes    Alcohol/week: 2.0 standard drinks    Types: 2 Cans of  beer per week  . Drug use: No    Patient Active Problem List   Diagnosis Date Noted  . Esophageal cancer ([place]) 12/22/2015  . Actinic keratosis 03/24/2015  . Abnormal hearing screen 10/21/2014  . Barrett's esophagus 01/19/2014  . Benign essential hypertension 01/19/2014  . Carotid artery disease ([place]) 01/19/2014  . Fatty liver 01/19/2014  . Acquired cyst of kidney 01/12/2014  . BPH with obstruction/lower urinary tract symptoms 01/12/2014  . ED (erectile dysfunction) of organic origin 01/12/2014    Home Medications:    Current Meds  Medication Sig  . amLODipine (NORVASC) 10 MG tablet Take 10 mg by mouth daily.  . Ascorbic Acid 100 MG CHEW Chew by mouth.  Marland Kitchen aspirin 81 MG tablet Take 81 mg by mouth daily.  . Coenzyme Q10 (COQ10) 200 MG CAPS Take 200 mg by mouth.  . Cranberry Extract 250 MG TABS Take by mouth.  . losartan (COZAAR) 100 MG tablet Take 100 mg by mouth daily.  . mirtazapine (REMERON) 30 MG tablet Take 30 mg by mouth at bedtime.  Marland Kitchen omeprazole (PRILOSEC) 40 MG capsule Take 40 mg by mouth daily.  . tamsulosin (FLOMAX) 0.4 MG CAPS capsule Take 1 capsule (0.4 mg total) by mouth daily.    Allergies:   Penicillins and Simvastatin  Review of Systems (ROS): Review of Systems  Constitutional: Negative for chills, fatigue and fever.  Gastrointestinal: Negative  for nausea and vomiting.  Genitourinary: Positive for difficulty urinating and dysuria. Negative for discharge, enuresis, flank pain, frequency, genital sores and hematuria.  Musculoskeletal: Negative for myalgias.     Vital Signs: Today's Vitals   04/24/20 1404 04/24/20 1405  BP:  109/71  Pulse:  96  Resp:  18  Temp:  98.6 F (37 C)  TempSrc:  Oral  SpO2:  99%  Weight:  165 lb (74.8 kg)  Height:  6\' 1"  (1.854 m)  PainSc: 0-No pain     Physical Exam: Physical Exam Vitals and nursing note reviewed.  Constitutional:      Appearance: Normal appearance.  Cardiovascular:     Rate and Rhythm: Normal rate  and regular rhythm.     Pulses: Normal pulses.     Heart sounds: Normal heart sounds.  Pulmonary:     Effort: Pulmonary effort is normal.     Breath sounds: Normal breath sounds.  Abdominal:     Tenderness: There is no abdominal tenderness. There is no right CVA tenderness or left CVA tenderness.  Skin:    General: Skin is warm and dry.  Neurological:     General: No focal deficit present.     Mental Status: He is alert and oriented to person, place, and time.  Psychiatric:        Mood and Affect: Mood normal.        Behavior: Behavior normal.      Urgent Care Treatments / Results:   LABS: PLEASE NOTE: all labs that were ordered this encounter are listed, however only abnormal results are displayed. Labs Reviewed  URINALYSIS, COMPLETE (UACMP) WITH MICROSCOPIC - Abnormal; Notable for the following components:      Result Value   Color, Urine AMBER (*)    APPearance CLOUDY (*)    Hgb urine dipstick MODERATE (*)    Bilirubin Urine SMALL (*)    Ketones, ur TRACE (*)    Protein, ur 100 (*)    Leukocytes,Ua MODERATE (*)    Bacteria, UA MANY (*)    All other components within normal limits  URINE CULTURE    EKG: -None  RADIOLOGY: No results found.  PROCEDURES: Procedures  MEDICATIONS RECEIVED THIS VISIT: Medications - No data to display  PERTINENT CLINICAL COURSE NOTES/UPDATES:   Initial Impression / Assessment and Plan / Urgent Care Course:  Pertinent labs & imaging results that were available during my care of the patient were personally reviewed by me and considered in my medical decision making (see lab/imaging section of note for values and interpretations).  Miguel Krause is a 84 y.o. male who presents to [facility] today with complaints of dysuria, diagnosed with urinary tract infection, and treated as such with the medications below. NP and patient reviewed discharge instructions below during visit.   Patient is well appearing overall in clinic today.  He does not appear to be in any acute distress. Presenting symptoms (see HPI) and exam as documented above.   I have reviewed the follow up and strict return precautions for any new or worsening symptoms. Patient is aware of symptoms that would be deemed urgent/emergent, and would thus require further evaluation either here or in the emergency department. At the time of discharge, he verbalized understanding and consent with the discharge plan as it was reviewed with him. All questions were fielded by provider and/or clinic staff prior to patient discharge.    Final Clinical Impressions / Urgent Care Diagnoses:   Final diagnoses:  Acute cystitis with hematuria    New Prescriptions:  [place] Controlled Substance Registry consulted? Not Applicable  Meds ordered this encounter  Medications  . sulfamethoxazole-trimethoprim (BACTRIM) 400-80 MG tablet    Sig: Take 2 tablets by mouth 2 (two) times daily for 7 days.    Dispense:  28 tablet    Refill:  0      Discharge Instructions     You were seen for urinary urgency and are being treated for urinary tract infection.   We are sending your urine out for a culture. If we need to add or change any medications, our nurse will give you a call to let you know.  Take the antibiotics as prescribed until they're finished. If you think you're having a reaction, stop the medication, take benadryl and go to the nearest urgent care/emergency room. Take a probiotic while taking the antibiotic to decrease the chances of stomach upset.   Increase your water intake.  Take care, Dr. Marland Kitchen, NP-c      Recommended Follow up Care:  Patient encouraged to follow up with the following provider within the specified time frame, or sooner as dictated by the severity of his symptoms. As always, he was instructed that for any urgent/emergent care needs, he should seek care either here or in the emergency department for more immediate evaluation.   Gertie Baron, DNP, NP-c    Gertie Baron, NP 04/24/20 1428

## 2020-04-27 LAB — URINE CULTURE: Culture: >=100000 — AB

## 2022-07-15 DEATH — deceased
